# Patient Record
Sex: Female | Born: 1940 | Race: White | Hispanic: No | Marital: Married | Smoking: Never smoker
Health system: Southern US, Community
[De-identification: ages and names within clinical notes are randomized; demographics above are authoritative.]

## PROBLEM LIST (undated history)

## (undated) DIAGNOSIS — B029 Zoster without complications: Secondary | ICD-10-CM

## (undated) HISTORY — PX: BREAST SURGERY: SHX581

## (undated) HISTORY — PX: ABDOMINAL HYSTERECTOMY: SHX81

## (undated) HISTORY — DX: Zoster without complications: B02.9

---

## 2013-11-11 DIAGNOSIS — Z1231 Encounter for screening mammogram for malignant neoplasm of breast: Secondary | ICD-10-CM | POA: Diagnosis not present

## 2014-07-01 DIAGNOSIS — Z23 Encounter for immunization: Secondary | ICD-10-CM | POA: Diagnosis not present

## 2014-07-19 DIAGNOSIS — L821 Other seborrheic keratosis: Secondary | ICD-10-CM | POA: Diagnosis not present

## 2014-07-23 DIAGNOSIS — J9801 Acute bronchospasm: Secondary | ICD-10-CM | POA: Diagnosis not present

## 2014-07-26 DIAGNOSIS — H35363 Drusen (degenerative) of macula, bilateral: Secondary | ICD-10-CM | POA: Diagnosis not present

## 2014-07-26 DIAGNOSIS — H2513 Age-related nuclear cataract, bilateral: Secondary | ICD-10-CM | POA: Diagnosis not present

## 2014-07-28 DIAGNOSIS — R05 Cough: Secondary | ICD-10-CM | POA: Diagnosis not present

## 2014-11-28 DIAGNOSIS — Z1231 Encounter for screening mammogram for malignant neoplasm of breast: Secondary | ICD-10-CM | POA: Diagnosis not present

## 2015-02-16 DIAGNOSIS — J069 Acute upper respiratory infection, unspecified: Secondary | ICD-10-CM | POA: Diagnosis not present

## 2015-04-05 DIAGNOSIS — M8580 Other specified disorders of bone density and structure, unspecified site: Secondary | ICD-10-CM | POA: Diagnosis not present

## 2015-04-05 DIAGNOSIS — Z79899 Other long term (current) drug therapy: Secondary | ICD-10-CM | POA: Diagnosis not present

## 2015-04-05 DIAGNOSIS — Z23 Encounter for immunization: Secondary | ICD-10-CM | POA: Diagnosis not present

## 2015-04-05 DIAGNOSIS — E559 Vitamin D deficiency, unspecified: Secondary | ICD-10-CM | POA: Diagnosis not present

## 2015-04-05 DIAGNOSIS — Z0000 Encounter for general adult medical examination without abnormal findings: Secondary | ICD-10-CM | POA: Diagnosis not present

## 2015-04-05 DIAGNOSIS — E782 Mixed hyperlipidemia: Secondary | ICD-10-CM | POA: Diagnosis not present

## 2015-04-05 DIAGNOSIS — I2510 Atherosclerotic heart disease of native coronary artery without angina pectoris: Secondary | ICD-10-CM | POA: Diagnosis not present

## 2015-05-22 DIAGNOSIS — J209 Acute bronchitis, unspecified: Secondary | ICD-10-CM | POA: Diagnosis not present

## 2015-06-22 DIAGNOSIS — Z23 Encounter for immunization: Secondary | ICD-10-CM | POA: Diagnosis not present

## 2015-07-07 DIAGNOSIS — E782 Mixed hyperlipidemia: Secondary | ICD-10-CM | POA: Diagnosis not present

## 2015-11-29 DIAGNOSIS — Z1231 Encounter for screening mammogram for malignant neoplasm of breast: Secondary | ICD-10-CM | POA: Diagnosis not present

## 2016-01-02 DIAGNOSIS — M6701 Short Achilles tendon (acquired), right ankle: Secondary | ICD-10-CM | POA: Diagnosis not present

## 2016-01-02 DIAGNOSIS — M722 Plantar fascial fibromatosis: Secondary | ICD-10-CM | POA: Diagnosis not present

## 2016-01-08 DIAGNOSIS — K581 Irritable bowel syndrome with constipation: Secondary | ICD-10-CM | POA: Diagnosis not present

## 2016-01-08 DIAGNOSIS — R109 Unspecified abdominal pain: Secondary | ICD-10-CM | POA: Diagnosis not present

## 2016-01-08 DIAGNOSIS — N8111 Cystocele, midline: Secondary | ICD-10-CM | POA: Diagnosis not present

## 2016-01-24 DIAGNOSIS — N8111 Cystocele, midline: Secondary | ICD-10-CM | POA: Diagnosis not present

## 2016-03-15 DIAGNOSIS — R040 Epistaxis: Secondary | ICD-10-CM | POA: Diagnosis not present

## 2016-03-15 DIAGNOSIS — J340 Abscess, furuncle and carbuncle of nose: Secondary | ICD-10-CM | POA: Diagnosis not present

## 2016-04-22 DIAGNOSIS — J3489 Other specified disorders of nose and nasal sinuses: Secondary | ICD-10-CM | POA: Diagnosis not present

## 2016-04-22 DIAGNOSIS — Z1389 Encounter for screening for other disorder: Secondary | ICD-10-CM | POA: Diagnosis not present

## 2016-04-22 DIAGNOSIS — Z0000 Encounter for general adult medical examination without abnormal findings: Secondary | ICD-10-CM | POA: Diagnosis not present

## 2016-04-22 DIAGNOSIS — N8111 Cystocele, midline: Secondary | ICD-10-CM | POA: Diagnosis not present

## 2016-04-22 DIAGNOSIS — E782 Mixed hyperlipidemia: Secondary | ICD-10-CM | POA: Diagnosis not present

## 2016-04-22 DIAGNOSIS — Z23 Encounter for immunization: Secondary | ICD-10-CM | POA: Diagnosis not present

## 2016-04-22 DIAGNOSIS — Z79899 Other long term (current) drug therapy: Secondary | ICD-10-CM | POA: Diagnosis not present

## 2016-04-30 DIAGNOSIS — D485 Neoplasm of uncertain behavior of skin: Secondary | ICD-10-CM | POA: Diagnosis not present

## 2016-04-30 DIAGNOSIS — C44519 Basal cell carcinoma of skin of other part of trunk: Secondary | ICD-10-CM | POA: Diagnosis not present

## 2016-04-30 DIAGNOSIS — L821 Other seborrheic keratosis: Secondary | ICD-10-CM | POA: Diagnosis not present

## 2016-05-02 DIAGNOSIS — R040 Epistaxis: Secondary | ICD-10-CM | POA: Diagnosis not present

## 2016-05-14 DIAGNOSIS — C44519 Basal cell carcinoma of skin of other part of trunk: Secondary | ICD-10-CM | POA: Diagnosis not present

## 2016-05-28 DIAGNOSIS — Z23 Encounter for immunization: Secondary | ICD-10-CM | POA: Diagnosis not present

## 2016-06-11 DIAGNOSIS — H2513 Age-related nuclear cataract, bilateral: Secondary | ICD-10-CM | POA: Diagnosis not present

## 2016-06-11 DIAGNOSIS — H35363 Drusen (degenerative) of macula, bilateral: Secondary | ICD-10-CM | POA: Diagnosis not present

## 2016-07-30 DIAGNOSIS — Z85828 Personal history of other malignant neoplasm of skin: Secondary | ICD-10-CM | POA: Diagnosis not present

## 2016-07-30 DIAGNOSIS — L821 Other seborrheic keratosis: Secondary | ICD-10-CM | POA: Diagnosis not present

## 2016-07-30 DIAGNOSIS — Z08 Encounter for follow-up examination after completed treatment for malignant neoplasm: Secondary | ICD-10-CM | POA: Diagnosis not present

## 2016-09-04 DIAGNOSIS — J209 Acute bronchitis, unspecified: Secondary | ICD-10-CM | POA: Diagnosis not present

## 2016-09-17 DIAGNOSIS — Z23 Encounter for immunization: Secondary | ICD-10-CM | POA: Diagnosis not present

## 2016-12-16 DIAGNOSIS — Z1231 Encounter for screening mammogram for malignant neoplasm of breast: Secondary | ICD-10-CM | POA: Diagnosis not present

## 2016-12-23 DIAGNOSIS — J22 Unspecified acute lower respiratory infection: Secondary | ICD-10-CM | POA: Diagnosis not present

## 2016-12-23 DIAGNOSIS — Z6822 Body mass index (BMI) 22.0-22.9, adult: Secondary | ICD-10-CM | POA: Diagnosis not present

## 2017-02-20 DIAGNOSIS — R040 Epistaxis: Secondary | ICD-10-CM | POA: Diagnosis not present

## 2017-04-04 DIAGNOSIS — Z6822 Body mass index (BMI) 22.0-22.9, adult: Secondary | ICD-10-CM | POA: Diagnosis not present

## 2017-04-04 DIAGNOSIS — J22 Unspecified acute lower respiratory infection: Secondary | ICD-10-CM | POA: Diagnosis not present

## 2017-05-01 DIAGNOSIS — M1611 Unilateral primary osteoarthritis, right hip: Secondary | ICD-10-CM | POA: Diagnosis not present

## 2017-05-06 DIAGNOSIS — M1611 Unilateral primary osteoarthritis, right hip: Secondary | ICD-10-CM | POA: Diagnosis not present

## 2017-05-16 DIAGNOSIS — M1611 Unilateral primary osteoarthritis, right hip: Secondary | ICD-10-CM | POA: Diagnosis not present

## 2017-05-16 DIAGNOSIS — M25551 Pain in right hip: Secondary | ICD-10-CM | POA: Diagnosis not present

## 2017-05-29 DIAGNOSIS — Z23 Encounter for immunization: Secondary | ICD-10-CM | POA: Diagnosis not present

## 2017-06-17 ENCOUNTER — Encounter: Payer: Self-pay | Admitting: Cardiology

## 2017-06-17 DIAGNOSIS — Z0000 Encounter for general adult medical examination without abnormal findings: Secondary | ICD-10-CM | POA: Diagnosis not present

## 2017-06-17 DIAGNOSIS — M1611 Unilateral primary osteoarthritis, right hip: Secondary | ICD-10-CM | POA: Diagnosis not present

## 2017-06-17 DIAGNOSIS — Z79899 Other long term (current) drug therapy: Secondary | ICD-10-CM | POA: Diagnosis not present

## 2017-06-17 DIAGNOSIS — E782 Mixed hyperlipidemia: Secondary | ICD-10-CM | POA: Diagnosis not present

## 2017-06-17 DIAGNOSIS — E559 Vitamin D deficiency, unspecified: Secondary | ICD-10-CM | POA: Diagnosis not present

## 2017-06-25 DIAGNOSIS — H2513 Age-related nuclear cataract, bilateral: Secondary | ICD-10-CM | POA: Diagnosis not present

## 2017-06-25 DIAGNOSIS — H35363 Drusen (degenerative) of macula, bilateral: Secondary | ICD-10-CM | POA: Diagnosis not present

## 2017-07-30 DIAGNOSIS — M545 Low back pain: Secondary | ICD-10-CM | POA: Diagnosis not present

## 2017-07-30 DIAGNOSIS — M25552 Pain in left hip: Secondary | ICD-10-CM | POA: Diagnosis not present

## 2017-07-30 DIAGNOSIS — M25551 Pain in right hip: Secondary | ICD-10-CM | POA: Diagnosis not present

## 2017-08-06 DIAGNOSIS — Z6822 Body mass index (BMI) 22.0-22.9, adult: Secondary | ICD-10-CM | POA: Diagnosis not present

## 2017-08-06 DIAGNOSIS — J069 Acute upper respiratory infection, unspecified: Secondary | ICD-10-CM | POA: Diagnosis not present

## 2017-08-07 DIAGNOSIS — J069 Acute upper respiratory infection, unspecified: Secondary | ICD-10-CM | POA: Diagnosis not present

## 2017-08-07 DIAGNOSIS — Z6822 Body mass index (BMI) 22.0-22.9, adult: Secondary | ICD-10-CM | POA: Diagnosis not present

## 2017-11-14 DIAGNOSIS — Z6823 Body mass index (BMI) 23.0-23.9, adult: Secondary | ICD-10-CM | POA: Diagnosis not present

## 2017-11-14 DIAGNOSIS — H6981 Other specified disorders of Eustachian tube, right ear: Secondary | ICD-10-CM | POA: Diagnosis not present

## 2017-11-14 DIAGNOSIS — J0100 Acute maxillary sinusitis, unspecified: Secondary | ICD-10-CM | POA: Diagnosis not present

## 2017-12-03 DIAGNOSIS — Z6823 Body mass index (BMI) 23.0-23.9, adult: Secondary | ICD-10-CM | POA: Diagnosis not present

## 2017-12-03 DIAGNOSIS — J069 Acute upper respiratory infection, unspecified: Secondary | ICD-10-CM | POA: Diagnosis not present

## 2017-12-03 DIAGNOSIS — R509 Fever, unspecified: Secondary | ICD-10-CM | POA: Diagnosis not present

## 2017-12-03 DIAGNOSIS — J0100 Acute maxillary sinusitis, unspecified: Secondary | ICD-10-CM | POA: Diagnosis not present

## 2017-12-26 DIAGNOSIS — Z1231 Encounter for screening mammogram for malignant neoplasm of breast: Secondary | ICD-10-CM | POA: Diagnosis not present

## 2018-03-26 ENCOUNTER — Other Ambulatory Visit: Payer: Self-pay

## 2018-05-13 DIAGNOSIS — Z6821 Body mass index (BMI) 21.0-21.9, adult: Secondary | ICD-10-CM | POA: Diagnosis not present

## 2018-05-13 DIAGNOSIS — R0600 Dyspnea, unspecified: Secondary | ICD-10-CM | POA: Diagnosis not present

## 2018-05-13 DIAGNOSIS — R0602 Shortness of breath: Secondary | ICD-10-CM | POA: Diagnosis not present

## 2018-05-13 DIAGNOSIS — R0789 Other chest pain: Secondary | ICD-10-CM | POA: Diagnosis not present

## 2018-05-13 DIAGNOSIS — R457 State of emotional shock and stress, unspecified: Secondary | ICD-10-CM | POA: Diagnosis not present

## 2018-05-13 DIAGNOSIS — R9431 Abnormal electrocardiogram [ECG] [EKG]: Secondary | ICD-10-CM | POA: Diagnosis not present

## 2018-05-18 ENCOUNTER — Encounter: Payer: Self-pay | Admitting: Cardiology

## 2018-05-18 DIAGNOSIS — R05 Cough: Secondary | ICD-10-CM | POA: Insufficient documentation

## 2018-05-18 DIAGNOSIS — R0602 Shortness of breath: Secondary | ICD-10-CM | POA: Insufficient documentation

## 2018-05-18 DIAGNOSIS — R059 Cough, unspecified: Secondary | ICD-10-CM | POA: Insufficient documentation

## 2018-05-18 DIAGNOSIS — R0789 Other chest pain: Secondary | ICD-10-CM | POA: Insufficient documentation

## 2018-05-19 ENCOUNTER — Encounter: Payer: Self-pay | Admitting: Cardiology

## 2018-05-19 ENCOUNTER — Ambulatory Visit (INDEPENDENT_AMBULATORY_CARE_PROVIDER_SITE_OTHER): Payer: Medicare Other | Admitting: Cardiology

## 2018-05-19 VITALS — BP 118/72 | HR 81 | Ht 61.0 in | Wt 117.0 lb

## 2018-05-19 DIAGNOSIS — R05 Cough: Secondary | ICD-10-CM

## 2018-05-19 DIAGNOSIS — E782 Mixed hyperlipidemia: Secondary | ICD-10-CM | POA: Insufficient documentation

## 2018-05-19 DIAGNOSIS — R0789 Other chest pain: Secondary | ICD-10-CM | POA: Diagnosis not present

## 2018-05-19 DIAGNOSIS — R0602 Shortness of breath: Secondary | ICD-10-CM | POA: Diagnosis not present

## 2018-05-19 DIAGNOSIS — R059 Cough, unspecified: Secondary | ICD-10-CM

## 2018-05-19 NOTE — Progress Notes (Signed)
Cardiology Office Note:    Date:  05/19/2018   ID:  Heather Henderson, DOB [birth year], MRN [id number]  PCP:  Myer Peer, MD  Cardiologist:  Jenean Lindau, MD   Referring MD: Myer Peer, MD    ASSESSMENT:    1. SOB (shortness of breath)   2. Cough   3. Chest discomfort   4. Mixed dyslipidemia    PLAN:    In order of problems listed above:  1. Primary prevention stressed to the patient.  Importance of compliance with diet and medication stressed and she vocalized understanding.  Her blood pressure is stable.  Her lipids were markedly elevated in the past and she has been on rosuvastatin after that.  Diet was discussed with dyslipidemia. 2. In view of her symptoms of shortness of breath I would like to do a d-dimer.  She has had breakfast this morning.  She does not want to get blood work done today.  She says she will come in the morning and get all blood work done including fasting lipids and d-dimer. 3. Echocardiogram will be done to assess murmur heard on auscultation. 4. In view of her significant symptoms and risk factors she will undergo exercise stress Cardiolite.  She knows to go to the nearest emergency room for any concerning symptoms.  Her daughter is a Marine scientist. 5. Patient will be seen in follow-up appointment in 6 months or earlier if the patient has any concerns    Medication Adjustments/Labs and Tests Ordered: Current medicines are reviewed at length with the patient today.  Concerns regarding medicines are outlined above.  No orders of the defined types were placed in this encounter.  No orders of the defined types were placed in this encounter.    History of Present Illness:    Heather Henderson is a 77 y.o. female who is being seen today for the evaluation of chest discomfort and shortness of breath on exertion at the request of Myer Peer, MD.  Patient is a pleasant 77 year old female.  She has past medical history of dyslipidemia.  She mentions to me that  she had one episode of shortness of breath on exertion and she went to the emergency room and was evaluated and discharged.  Subsequently she is done fine.  She occasionally has chest discomfort not related to exertion.  No orthopnea or PND or radiation to any part of the body.  Her daughter accompanies her for this visit.  She mentions to me that she is generally fatigued and wants her to be more active.  Past Medical History:  Diagnosis Date  . Herpes zoster     Past Surgical History:  Procedure Laterality Date  . ABDOMINAL HYSTERECTOMY    . BREAST SURGERY      Current Medications: Current Meds  Medication Sig  . azelastine (ASTELIN) 0.1 % nasal spray Place 1 spray into both nostrils 2 (two) times daily. Use in each nostril as directed  . azithromycin (ZITHROMAX) 500 MG tablet Take 500 mg by mouth daily.  . celecoxib (CELEBREX) 200 MG capsule Take 200 mg by mouth as needed.   . fluticasone (FLONASE) 50 MCG/ACT nasal spray Place 1 spray into both nostrils daily.  . rosuvastatin (CRESTOR) 10 MG tablet Take 10 mg by mouth daily.     Allergies:   Patient has no known allergies.   Social History   Socioeconomic History  . Marital status: Married    Spouse name: Not on file  . Number  of children: Not on file  . Years of education: Not on file  . Highest education level: Not on file  Occupational History  . Not on file  Social Needs  . Financial resource strain: Not on file  . Food insecurity:    Worry: Not on file    Inability: Not on file  . Transportation needs:    Medical: Not on file    Non-medical: Not on file  Tobacco Use  . Smoking status: Never Smoker  . Smokeless tobacco: Never Used  Substance and Sexual Activity  . Alcohol use: Not on file  . Drug use: Not on file  . Sexual activity: Not on file  Lifestyle  . Physical activity:    Days per week: Not on file    Minutes per session: Not on file  . Stress: Not on file  Relationships  . Social connections:     Talks on phone: Not on file    Gets together: Not on file    Attends religious service: Not on file    Active member of club or organization: Not on file    Attends meetings of clubs or organizations: Not on file    Relationship status: Not on file  Other Topics Concern  . Not on file  Social History Narrative  . Not on file     Family History: The patient's family history includes COPD in her mother; Heart disease in her father.  ROS:   Please see the history of present illness.    All other systems reviewed and are negative.  EKGs/Labs/Other Studies Reviewed:    The following studies were reviewed today: EKG reveals sinus rhythm and nonspecific ST-T changes   Recent Labs: No results found for requested labs within last 8760 hours.  Recent Lipid Panel No results found for: CHOL, TRIG, HDL, CHOLHDL, VLDL, LDLCALC, LDLDIRECT  Physical Exam:    VS:  BP 118/72 (BP Location: Right Arm, Patient Position: Sitting, Cuff Size: Normal)   Pulse 81   Ht 5\' 1"  (1.549 m)   Wt 117 lb (53.1 kg)   SpO2 98%   BMI 22.11 kg/m     Wt Readings from Last 3 Encounters:  05/19/18 117 lb (53.1 kg)     GEN: Patient is in no acute distress HEENT: Normal NECK: No JVD; No carotid bruits LYMPHATICS: No lymphadenopathy CARDIAC: S1 S2 regular, 2/6 systolic murmur at the apex. RESPIRATORY:  Clear to auscultation without rales, wheezing or rhonchi  ABDOMEN: Soft, non-tender, non-distended MUSCULOSKELETAL:  No edema; No deformity  SKIN: Warm and dry NEUROLOGIC:  Alert and oriented x 3 PSYCHIATRIC:  Normal affect    Signed, Jenean Lindau, MD  05/19/2018 11:39 AM    [place]

## 2018-05-19 NOTE — Patient Instructions (Addendum)
Medication Instructions:  Your physician recommends that you continue on your current medications as directed. Please refer to the Current Medication list given to you today.  Labwork: Your physician recommends that you have the following labs drawn: BMP, CBC, TSH, d-dimer today.  Please come fasting for liver and lipid panel. No appointment necessary.  Testing/Procedures: Your physician has requested that you have an echocardiogram. Echocardiography is a painless test that uses sound waves to create images of your heart. It provides your doctor with information about the size and shape of your heart and how well your heart's chambers and valves are working. This procedure takes approximately one hour. There are no restrictions for this procedure.  Your physician has requested that you have en exercise stress myoview. For further information please visit [email or web address]. Please follow instruction sheet, as given.  Follow-Up: Your physician recommends that you schedule a follow-up appointment in: 6 months  Any Other Special Instructions Will Be Listed Below (If Applicable).     If you need a refill on your cardiac medications before your next appointment, please call your pharmacy.   [place], RN, BSN

## 2018-05-19 NOTE — Addendum Note (Signed)
Addended by: Mattie Marlin on: 05/19/2018 11:55 AM   Modules accepted: Orders

## 2018-05-20 ENCOUNTER — Encounter: Payer: Self-pay | Admitting: Cardiology

## 2018-05-20 ENCOUNTER — Other Ambulatory Visit: Payer: Self-pay

## 2018-05-20 ENCOUNTER — Telehealth: Payer: Self-pay | Admitting: Physician Assistant

## 2018-05-20 ENCOUNTER — Other Ambulatory Visit: Payer: Self-pay | Admitting: Cardiology

## 2018-05-20 DIAGNOSIS — R0602 Shortness of breath: Secondary | ICD-10-CM | POA: Diagnosis not present

## 2018-05-20 DIAGNOSIS — R7989 Other specified abnormal findings of blood chemistry: Secondary | ICD-10-CM | POA: Diagnosis not present

## 2018-05-20 DIAGNOSIS — E782 Mixed hyperlipidemia: Secondary | ICD-10-CM | POA: Diagnosis not present

## 2018-05-20 LAB — BASIC METABOLIC PANEL
BUN / CREAT RATIO: 22 (ref 12–28)
BUN: 13 mg/dL (ref 8–27)
CALCIUM: 9.4 mg/dL (ref 8.7–10.3)
CHLORIDE: 101 mmol/L (ref 96–106)
CO2: 25 mmol/L (ref 20–29)
Creatinine, Ser: 0.6 mg/dL (ref 0.57–1.00)
GFR, EST AFRICAN AMERICAN: 102 mL/min/{1.73_m2} (ref >59)
GFR, EST NON AFRICAN AMERICAN: 88 mL/min/{1.73_m2} (ref >59)
Glucose: 79 mg/dL (ref 65–99)
POTASSIUM: 5.1 mmol/L (ref 3.5–5.2)
Sodium: 141 mmol/L (ref 134–144)

## 2018-05-20 LAB — HEPATIC FUNCTION PANEL
ALBUMIN: 4.6 g/dL (ref 3.5–4.8)
ALT: 16 IU/L (ref 0–32)
AST: 22 IU/L (ref 0–40)
Alkaline Phosphatase: 78 IU/L (ref 39–117)
BILIRUBIN TOTAL: 0.6 mg/dL (ref 0.0–1.2)
Bilirubin, Direct: 0.16 mg/dL (ref 0.00–0.40)
TOTAL PROTEIN: 6.6 g/dL (ref 6.0–8.5)

## 2018-05-20 LAB — CBC WITH DIFFERENTIAL/PLATELET
BASOS ABS: 0 10*3/uL (ref 0.0–0.2)
BASOS: 1 %
EOS (ABSOLUTE): 0.1 10*3/uL (ref 0.0–0.4)
Eos: 3 %
HEMOGLOBIN: 14.1 g/dL (ref 11.1–15.9)
Hematocrit: 41.2 % (ref 34.0–46.6)
IMMATURE GRANS (ABS): 0 10*3/uL (ref 0.0–0.1)
Immature Granulocytes: 0 %
LYMPHS ABS: 1.1 10*3/uL (ref 0.7–3.1)
LYMPHS: 21 %
MCH: 31.3 pg (ref 26.6–33.0)
MCHC: 34.2 g/dL (ref 31.5–35.7)
MCV: 92 fL (ref 79–97)
MONOCYTES: 8 %
Monocytes Absolute: 0.4 10*3/uL (ref 0.1–0.9)
NEUTROS ABS: 3.4 10*3/uL (ref 1.4–7.0)
Neutrophils: 67 %
PLATELETS: 280 10*3/uL (ref 150–450)
RBC: 4.5 x10E6/uL (ref 3.77–5.28)
RDW: 12.9 % (ref 12.3–15.4)
WBC: 5.1 10*3/uL (ref 3.4–10.8)

## 2018-05-20 LAB — LIPID PANEL
CHOL/HDL RATIO: 3.3 ratio (ref 0.0–4.4)
Cholesterol, Total: 166 mg/dL (ref 100–199)
HDL: 51 mg/dL (ref >39)
LDL CALC: 86 mg/dL (ref 0–99)
Triglycerides: 147 mg/dL (ref 0–149)
VLDL Cholesterol Cal: 29 mg/dL (ref 5–40)

## 2018-05-20 LAB — TSH: TSH: 2.29 u[IU]/mL (ref 0.450–4.500)

## 2018-05-20 LAB — D-DIMER, QUANTITATIVE: D-DIMER: 0.71 mg/L FEU — ABNORMAL HIGH (ref 0.00–0.49)

## 2018-05-20 NOTE — Telephone Encounter (Signed)
Received call from [facility] regarding CT angio that read "negative for acute PE or other acute finding." Radiology relayed result to patient and will route to Dr. Lennox Pippins for further action if needed. I do not have access to full report so he will need to review upon receipt to his inbox . Dayna Dunn PA-C

## 2018-05-21 ENCOUNTER — Telehealth: Payer: Self-pay

## 2018-05-21 NOTE — Telephone Encounter (Signed)
Patient was called and notified of her test results.

## 2018-05-26 ENCOUNTER — Telehealth: Payer: Self-pay | Admitting: Cardiology

## 2018-05-26 NOTE — Telephone Encounter (Signed)
Has been having really bad SOB

## 2018-05-27 NOTE — Telephone Encounter (Signed)
Per the patient on Saturday she felt as if she got very short of breath with exertion. Patient did taker her pulse and BP readsing were: 147/74 pulse 98 and 140/67 and pulse 80. The patient states this only occurred on Saturday as she has been "taking it easy" to avoid getting short of breath again. Echo and lexi has been ordered for next week. Please advice for any changes or recommendations?

## 2018-05-27 NOTE — Telephone Encounter (Signed)
This was expressed to the patient and she was understanding.

## 2018-05-28 ENCOUNTER — Telehealth (HOSPITAL_COMMUNITY): Payer: Self-pay | Admitting: *Deleted

## 2018-05-28 NOTE — Telephone Encounter (Signed)
Patient given detailed instructions per Myocardial Perfusion Study Information Sheet for the test on 06/02/18. Patient notified to arrive 15 minutes early and that it is imperative to arrive on time for appointment to keep from having the test rescheduled.  If you need to cancel or reschedule your appointment, please call the office within 24 hours of your appointment. . Patient verbalized understanding. Kirstie Peri

## 2018-06-01 DIAGNOSIS — Z6822 Body mass index (BMI) 22.0-22.9, adult: Secondary | ICD-10-CM | POA: Diagnosis not present

## 2018-06-01 DIAGNOSIS — J329 Chronic sinusitis, unspecified: Secondary | ICD-10-CM | POA: Diagnosis not present

## 2018-06-01 DIAGNOSIS — Z23 Encounter for immunization: Secondary | ICD-10-CM | POA: Diagnosis not present

## 2018-06-01 DIAGNOSIS — J40 Bronchitis, not specified as acute or chronic: Secondary | ICD-10-CM | POA: Diagnosis not present

## 2018-06-02 ENCOUNTER — Ambulatory Visit (INDEPENDENT_AMBULATORY_CARE_PROVIDER_SITE_OTHER): Payer: Medicare Other

## 2018-06-02 VITALS — Ht 61.0 in | Wt 117.0 lb

## 2018-06-02 DIAGNOSIS — R0789 Other chest pain: Secondary | ICD-10-CM | POA: Diagnosis not present

## 2018-06-02 DIAGNOSIS — R0602 Shortness of breath: Secondary | ICD-10-CM | POA: Diagnosis not present

## 2018-06-02 LAB — MYOCARDIAL PERFUSION IMAGING
CHL CUP NUCLEAR SDS: 0
CHL CUP NUCLEAR SRS: 0
CSEPPHR: 129 {beats}/min
Estimated workload: 8.9 METS
Exercise duration (min): 7 min
Exercise duration (sec): 1 s
LVDIAVOL: 35 mL (ref 46–106)
LVSYSVOL: 7 mL
MPHR: 143 {beats}/min
Percent HR: 90 %
Rest HR: 71 {beats}/min
SSS: 0
TID: 0.92

## 2018-06-02 IMAGING — NM NM CHEST EXAM
6 series · 36 of 36 positions shown · non-contrast
Comparison: none

[Series 1: rest sax · 6.4mm · 6.40mm/px · 6 of 21 frames shown]
[frame 2/21]
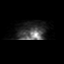
[frame 6/21]
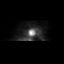
[frame 9/21]
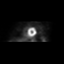
[frame 13/21]
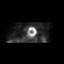
[frame 16/21]
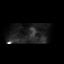
[frame 20/21]
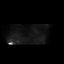

[Series 1: wbr rest · 6.40mm/px · 6 of 64 frames shown]
[frame 6/64]
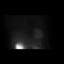
[frame 16/64]
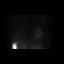
[frame 27/64]
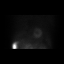
[frame 38/64]
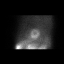
[frame 48/64]
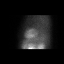
[frame 59/64]
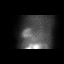

[Series 2: wbr stress-gsp · 6.40mm/px · 6 of 512 frames shown]
[frame 43/512]
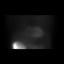
[frame 128/512]
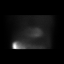
[frame 214/512]
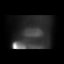
[frame 299/512]
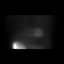
[frame 384/512]
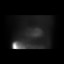
[frame 470/512]
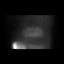

[Series 2: stress sax gs · 6.4mm · 6.40mm/px · 6 of 168 frames shown]
[frame 15/168]
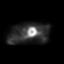
[frame 43/168]
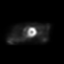
[frame 71/168]
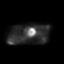
[frame 99/168]
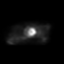
[frame 127/168]
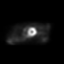
[frame 155/168]
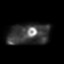

[Series 3: stress sax · 6.4mm · 6.40mm/px · 6 of 21 frames shown]
[frame 2/21]
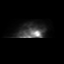
[frame 6/21]
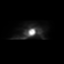
[frame 9/21]
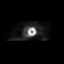
[frame 13/21]
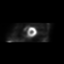
[frame 16/21]
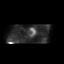
[frame 20/21]
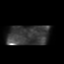

[Series 3: wbr stress-sum-em · 6.40mm/px · 6 of 64 frames shown]
[frame 6/64]
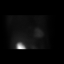
[frame 16/64]
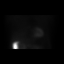
[frame 27/64]
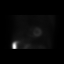
[frame 38/64]
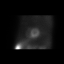
[frame 48/64]
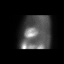
[frame 59/64]
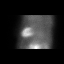

[36 of 36 positions shown; findings below may reference images not displayed]

Canned report from images found in remote index.

Refer to host system for actual result text.

## 2018-06-02 MED ORDER — TECHNETIUM TC 99M TETROFOSMIN IV KIT
32.4000 | PACK | Freq: Once | INTRAVENOUS | Status: AC | PRN
  Administered 2018-06-02: 32.4 via INTRAVENOUS

## 2018-06-02 MED ORDER — TECHNETIUM TC 99M TETROFOSMIN IV KIT
9.8000 | PACK | Freq: Once | INTRAVENOUS | Status: AC | PRN
  Administered 2018-06-02: 9.8 via INTRAVENOUS

## 2018-06-03 ENCOUNTER — Ambulatory Visit (INDEPENDENT_AMBULATORY_CARE_PROVIDER_SITE_OTHER): Payer: Medicare Other

## 2018-06-03 DIAGNOSIS — R0602 Shortness of breath: Secondary | ICD-10-CM | POA: Diagnosis not present

## 2018-06-03 DIAGNOSIS — R0789 Other chest pain: Secondary | ICD-10-CM

## 2018-06-03 NOTE — Progress Notes (Signed)
Complete echocardiogram has been performed.  Jimmy Ozzie Knobel RDCS, RVT 

## 2018-06-04 ENCOUNTER — Telehealth: Payer: Self-pay

## 2018-06-04 NOTE — Telephone Encounter (Signed)
-----   Message from Jenean Lindau, MD sent at 06/04/2018  8:14 AM EDT ----- The results of the study is unremarkable. Please inform patient. I will discuss in detail at next appointment. Cc  primary care/referring physician Jenean Lindau, MD 06/04/2018 8:14 AM

## 2018-06-04 NOTE — Telephone Encounter (Signed)
Patient called and notified of test results. 

## 2018-07-16 DIAGNOSIS — H35363 Drusen (degenerative) of macula, bilateral: Secondary | ICD-10-CM | POA: Diagnosis not present

## 2018-07-16 DIAGNOSIS — H2513 Age-related nuclear cataract, bilateral: Secondary | ICD-10-CM | POA: Diagnosis not present

## 2018-08-20 DIAGNOSIS — Z87898 Personal history of other specified conditions: Secondary | ICD-10-CM | POA: Diagnosis not present

## 2018-08-20 DIAGNOSIS — M160 Bilateral primary osteoarthritis of hip: Secondary | ICD-10-CM | POA: Diagnosis not present

## 2018-08-20 DIAGNOSIS — E782 Mixed hyperlipidemia: Secondary | ICD-10-CM | POA: Diagnosis not present

## 2018-08-20 DIAGNOSIS — E559 Vitamin D deficiency, unspecified: Secondary | ICD-10-CM | POA: Diagnosis not present

## 2018-08-20 DIAGNOSIS — Z0000 Encounter for general adult medical examination without abnormal findings: Secondary | ICD-10-CM | POA: Diagnosis not present

## 2018-11-12 ENCOUNTER — Ambulatory Visit: Payer: Medicare Other | Admitting: Cardiology

## 2018-12-28 DIAGNOSIS — Z1231 Encounter for screening mammogram for malignant neoplasm of breast: Secondary | ICD-10-CM | POA: Diagnosis not present

## 2019-03-23 DIAGNOSIS — K120 Recurrent oral aphthae: Secondary | ICD-10-CM | POA: Diagnosis not present

## 2019-04-01 DIAGNOSIS — D485 Neoplasm of uncertain behavior of skin: Secondary | ICD-10-CM | POA: Diagnosis not present

## 2019-04-01 DIAGNOSIS — L820 Inflamed seborrheic keratosis: Secondary | ICD-10-CM | POA: Diagnosis not present

## 2019-04-01 DIAGNOSIS — Z85828 Personal history of other malignant neoplasm of skin: Secondary | ICD-10-CM | POA: Diagnosis not present

## 2019-04-01 DIAGNOSIS — Z08 Encounter for follow-up examination after completed treatment for malignant neoplasm: Secondary | ICD-10-CM | POA: Diagnosis not present

## 2019-04-01 DIAGNOSIS — L821 Other seborrheic keratosis: Secondary | ICD-10-CM | POA: Diagnosis not present

## 2019-04-01 DIAGNOSIS — L578 Other skin changes due to chronic exposure to nonionizing radiation: Secondary | ICD-10-CM | POA: Diagnosis not present

## 2019-05-22 DIAGNOSIS — Z23 Encounter for immunization: Secondary | ICD-10-CM | POA: Diagnosis not present

## 2019-05-27 DIAGNOSIS — M160 Bilateral primary osteoarthritis of hip: Secondary | ICD-10-CM | POA: Diagnosis not present

## 2019-05-27 DIAGNOSIS — R739 Hyperglycemia, unspecified: Secondary | ICD-10-CM | POA: Diagnosis not present

## 2019-05-27 DIAGNOSIS — E782 Mixed hyperlipidemia: Secondary | ICD-10-CM | POA: Diagnosis not present

## 2019-05-27 DIAGNOSIS — F419 Anxiety disorder, unspecified: Secondary | ICD-10-CM | POA: Diagnosis not present

## 2019-05-27 DIAGNOSIS — M8580 Other specified disorders of bone density and structure, unspecified site: Secondary | ICD-10-CM | POA: Diagnosis not present

## 2019-05-27 DIAGNOSIS — Z79899 Other long term (current) drug therapy: Secondary | ICD-10-CM | POA: Diagnosis not present

## 2019-07-19 DIAGNOSIS — H35363 Drusen (degenerative) of macula, bilateral: Secondary | ICD-10-CM | POA: Diagnosis not present

## 2019-07-19 DIAGNOSIS — H43393 Other vitreous opacities, bilateral: Secondary | ICD-10-CM | POA: Diagnosis not present

## 2020-03-31 DIAGNOSIS — J0120 Acute ethmoidal sinusitis, unspecified: Secondary | ICD-10-CM | POA: Diagnosis not present

## 2020-11-14 DIAGNOSIS — E782 Mixed hyperlipidemia: Secondary | ICD-10-CM | POA: Diagnosis not present

## 2020-11-14 DIAGNOSIS — I25119 Atherosclerotic heart disease of native coronary artery with unspecified angina pectoris: Secondary | ICD-10-CM | POA: Diagnosis not present

## 2020-11-14 DIAGNOSIS — H9313 Tinnitus, bilateral: Secondary | ICD-10-CM | POA: Diagnosis not present

## 2020-11-14 DIAGNOSIS — J301 Allergic rhinitis due to pollen: Secondary | ICD-10-CM | POA: Diagnosis not present

## 2020-11-23 DIAGNOSIS — H524 Presbyopia: Secondary | ICD-10-CM | POA: Diagnosis not present

## 2020-11-23 DIAGNOSIS — H35363 Drusen (degenerative) of macula, bilateral: Secondary | ICD-10-CM | POA: Diagnosis not present

## 2020-11-23 DIAGNOSIS — H2513 Age-related nuclear cataract, bilateral: Secondary | ICD-10-CM | POA: Diagnosis not present

## 2020-11-23 DIAGNOSIS — H3093 Unspecified chorioretinal inflammation, bilateral: Secondary | ICD-10-CM | POA: Diagnosis not present

## 2020-11-23 DIAGNOSIS — H43393 Other vitreous opacities, bilateral: Secondary | ICD-10-CM | POA: Diagnosis not present

## 2020-11-23 DIAGNOSIS — H43811 Vitreous degeneration, right eye: Secondary | ICD-10-CM | POA: Diagnosis not present

## 2021-01-01 DIAGNOSIS — Z1231 Encounter for screening mammogram for malignant neoplasm of breast: Secondary | ICD-10-CM | POA: Diagnosis not present

## 2021-01-31 DIAGNOSIS — M25541 Pain in joints of right hand: Secondary | ICD-10-CM | POA: Diagnosis not present

## 2021-01-31 DIAGNOSIS — M25542 Pain in joints of left hand: Secondary | ICD-10-CM | POA: Diagnosis not present

## 2021-01-31 DIAGNOSIS — M19031 Primary osteoarthritis, right wrist: Secondary | ICD-10-CM | POA: Diagnosis not present

## 2021-01-31 DIAGNOSIS — M19032 Primary osteoarthritis, left wrist: Secondary | ICD-10-CM | POA: Diagnosis not present

## 2021-02-01 DIAGNOSIS — K1120 Sialoadenitis, unspecified: Secondary | ICD-10-CM | POA: Diagnosis not present

## 2021-02-01 DIAGNOSIS — K0500 Acute gingivitis, plaque induced: Secondary | ICD-10-CM | POA: Diagnosis not present

## 2021-02-01 DIAGNOSIS — Z6822 Body mass index (BMI) 22.0-22.9, adult: Secondary | ICD-10-CM | POA: Diagnosis not present

## 2021-02-01 DIAGNOSIS — H9313 Tinnitus, bilateral: Secondary | ICD-10-CM | POA: Diagnosis not present

## 2021-03-06 DIAGNOSIS — G5601 Carpal tunnel syndrome, right upper limb: Secondary | ICD-10-CM | POA: Diagnosis not present

## 2021-03-21 DIAGNOSIS — Z87891 Personal history of nicotine dependence: Secondary | ICD-10-CM | POA: Diagnosis not present

## 2021-03-21 DIAGNOSIS — R040 Epistaxis: Secondary | ICD-10-CM | POA: Diagnosis not present

## 2021-03-21 DIAGNOSIS — E785 Hyperlipidemia, unspecified: Secondary | ICD-10-CM | POA: Diagnosis not present

## 2021-03-21 DIAGNOSIS — I10 Essential (primary) hypertension: Secondary | ICD-10-CM | POA: Diagnosis not present

## 2021-03-22 DIAGNOSIS — Z7901 Long term (current) use of anticoagulants: Secondary | ICD-10-CM | POA: Diagnosis not present

## 2021-03-22 DIAGNOSIS — R040 Epistaxis: Secondary | ICD-10-CM | POA: Diagnosis not present

## 2021-03-28 DIAGNOSIS — I252 Old myocardial infarction: Secondary | ICD-10-CM | POA: Diagnosis not present

## 2021-03-28 DIAGNOSIS — E782 Mixed hyperlipidemia: Secondary | ICD-10-CM | POA: Diagnosis not present

## 2021-03-28 DIAGNOSIS — Z7689 Persons encountering health services in other specified circumstances: Secondary | ICD-10-CM | POA: Diagnosis not present

## 2021-03-28 DIAGNOSIS — I10 Essential (primary) hypertension: Secondary | ICD-10-CM | POA: Diagnosis not present

## 2021-03-28 DIAGNOSIS — Z87898 Personal history of other specified conditions: Secondary | ICD-10-CM | POA: Diagnosis not present

## 2021-04-09 DIAGNOSIS — D1801 Hemangioma of skin and subcutaneous tissue: Secondary | ICD-10-CM | POA: Diagnosis not present

## 2021-04-09 DIAGNOSIS — L578 Other skin changes due to chronic exposure to nonionizing radiation: Secondary | ICD-10-CM | POA: Diagnosis not present

## 2021-04-09 DIAGNOSIS — Z129 Encounter for screening for malignant neoplasm, site unspecified: Secondary | ICD-10-CM | POA: Diagnosis not present

## 2021-04-09 DIAGNOSIS — L570 Actinic keratosis: Secondary | ICD-10-CM | POA: Diagnosis not present

## 2021-04-09 DIAGNOSIS — L821 Other seborrheic keratosis: Secondary | ICD-10-CM | POA: Diagnosis not present

## 2021-04-11 DIAGNOSIS — Z7689 Persons encountering health services in other specified circumstances: Secondary | ICD-10-CM | POA: Diagnosis not present

## 2021-04-11 DIAGNOSIS — E782 Mixed hyperlipidemia: Secondary | ICD-10-CM | POA: Diagnosis not present

## 2021-04-11 DIAGNOSIS — M810 Age-related osteoporosis without current pathological fracture: Secondary | ICD-10-CM | POA: Diagnosis not present

## 2021-04-11 DIAGNOSIS — I10 Essential (primary) hypertension: Secondary | ICD-10-CM | POA: Diagnosis not present

## 2021-04-25 DIAGNOSIS — I10 Essential (primary) hypertension: Secondary | ICD-10-CM | POA: Diagnosis not present

## 2021-04-25 DIAGNOSIS — Z87898 Personal history of other specified conditions: Secondary | ICD-10-CM | POA: Diagnosis not present

## 2021-04-25 DIAGNOSIS — Z7689 Persons encountering health services in other specified circumstances: Secondary | ICD-10-CM | POA: Diagnosis not present

## 2021-06-27 DIAGNOSIS — Z0000 Encounter for general adult medical examination without abnormal findings: Secondary | ICD-10-CM | POA: Diagnosis not present

## 2021-06-27 DIAGNOSIS — Z780 Asymptomatic menopausal state: Secondary | ICD-10-CM | POA: Diagnosis not present

## 2021-06-27 DIAGNOSIS — Z7689 Persons encountering health services in other specified circumstances: Secondary | ICD-10-CM | POA: Diagnosis not present

## 2021-06-27 DIAGNOSIS — Z23 Encounter for immunization: Secondary | ICD-10-CM | POA: Diagnosis not present

## 2021-08-01 DIAGNOSIS — Z780 Asymptomatic menopausal state: Secondary | ICD-10-CM | POA: Diagnosis not present

## 2021-08-30 DIAGNOSIS — Z7689 Persons encountering health services in other specified circumstances: Secondary | ICD-10-CM | POA: Diagnosis not present

## 2021-08-30 DIAGNOSIS — R300 Dysuria: Secondary | ICD-10-CM | POA: Diagnosis not present

## 2021-10-02 DIAGNOSIS — M25561 Pain in right knee: Secondary | ICD-10-CM | POA: Diagnosis not present

## 2021-10-02 DIAGNOSIS — E782 Mixed hyperlipidemia: Secondary | ICD-10-CM | POA: Diagnosis not present

## 2021-10-02 DIAGNOSIS — M25462 Effusion, left knee: Secondary | ICD-10-CM | POA: Diagnosis not present

## 2021-10-02 DIAGNOSIS — I252 Old myocardial infarction: Secondary | ICD-10-CM | POA: Diagnosis not present

## 2021-10-02 DIAGNOSIS — Z7689 Persons encountering health services in other specified circumstances: Secondary | ICD-10-CM | POA: Diagnosis not present

## 2021-10-02 DIAGNOSIS — R0789 Other chest pain: Secondary | ICD-10-CM | POA: Diagnosis not present

## 2021-10-02 DIAGNOSIS — G8929 Other chronic pain: Secondary | ICD-10-CM | POA: Diagnosis not present

## 2021-10-02 DIAGNOSIS — M25461 Effusion, right knee: Secondary | ICD-10-CM | POA: Diagnosis not present

## 2021-10-02 DIAGNOSIS — M7989 Other specified soft tissue disorders: Secondary | ICD-10-CM | POA: Diagnosis not present

## 2021-10-02 DIAGNOSIS — I10 Essential (primary) hypertension: Secondary | ICD-10-CM | POA: Diagnosis not present

## 2021-10-24 DIAGNOSIS — R079 Chest pain, unspecified: Secondary | ICD-10-CM | POA: Diagnosis not present

## 2021-10-24 DIAGNOSIS — R0789 Other chest pain: Secondary | ICD-10-CM | POA: Diagnosis not present

## 2021-10-24 DIAGNOSIS — I252 Old myocardial infarction: Secondary | ICD-10-CM | POA: Diagnosis not present

## 2021-10-29 DIAGNOSIS — I10 Essential (primary) hypertension: Secondary | ICD-10-CM | POA: Diagnosis not present

## 2021-10-29 DIAGNOSIS — E782 Mixed hyperlipidemia: Secondary | ICD-10-CM | POA: Diagnosis not present

## 2021-10-29 DIAGNOSIS — Z7689 Persons encountering health services in other specified circumstances: Secondary | ICD-10-CM | POA: Diagnosis not present

## 2021-10-29 DIAGNOSIS — I252 Old myocardial infarction: Secondary | ICD-10-CM | POA: Diagnosis not present

## 2021-10-29 DIAGNOSIS — I25118 Atherosclerotic heart disease of native coronary artery with other forms of angina pectoris: Secondary | ICD-10-CM | POA: Diagnosis not present

## 2021-10-29 DIAGNOSIS — R0789 Other chest pain: Secondary | ICD-10-CM | POA: Diagnosis not present

## 2021-11-05 DIAGNOSIS — R0789 Other chest pain: Secondary | ICD-10-CM | POA: Diagnosis not present

## 2021-11-05 DIAGNOSIS — I25118 Atherosclerotic heart disease of native coronary artery with other forms of angina pectoris: Secondary | ICD-10-CM | POA: Diagnosis not present

## 2021-11-05 DIAGNOSIS — I252 Old myocardial infarction: Secondary | ICD-10-CM | POA: Diagnosis not present

## 2021-11-15 DIAGNOSIS — R931 Abnormal findings on diagnostic imaging of heart and coronary circulation: Secondary | ICD-10-CM | POA: Diagnosis not present

## 2021-11-15 DIAGNOSIS — Z87891 Personal history of nicotine dependence: Secondary | ICD-10-CM | POA: Diagnosis not present

## 2021-11-15 DIAGNOSIS — Z01818 Encounter for other preprocedural examination: Secondary | ICD-10-CM | POA: Diagnosis not present

## 2021-11-15 DIAGNOSIS — Z8249 Family history of ischemic heart disease and other diseases of the circulatory system: Secondary | ICD-10-CM | POA: Diagnosis not present

## 2021-11-15 DIAGNOSIS — E782 Mixed hyperlipidemia: Secondary | ICD-10-CM | POA: Diagnosis not present

## 2021-11-15 DIAGNOSIS — I252 Old myocardial infarction: Secondary | ICD-10-CM | POA: Diagnosis not present

## 2021-11-15 DIAGNOSIS — I25118 Atherosclerotic heart disease of native coronary artery with other forms of angina pectoris: Secondary | ICD-10-CM | POA: Diagnosis not present

## 2021-11-15 DIAGNOSIS — R0789 Other chest pain: Secondary | ICD-10-CM | POA: Diagnosis not present

## 2021-11-15 DIAGNOSIS — Z79899 Other long term (current) drug therapy: Secondary | ICD-10-CM | POA: Diagnosis not present

## 2021-11-15 DIAGNOSIS — I10 Essential (primary) hypertension: Secondary | ICD-10-CM | POA: Diagnosis not present

## 2021-11-15 DIAGNOSIS — Z8679 Personal history of other diseases of the circulatory system: Secondary | ICD-10-CM | POA: Diagnosis not present

## 2021-11-15 DIAGNOSIS — Z7982 Long term (current) use of aspirin: Secondary | ICD-10-CM | POA: Diagnosis not present

## 2021-11-27 DIAGNOSIS — R0789 Other chest pain: Secondary | ICD-10-CM | POA: Diagnosis not present

## 2021-11-27 DIAGNOSIS — Z7689 Persons encountering health services in other specified circumstances: Secondary | ICD-10-CM | POA: Diagnosis not present

## 2022-02-19 DIAGNOSIS — Z1231 Encounter for screening mammogram for malignant neoplasm of breast: Secondary | ICD-10-CM | POA: Diagnosis not present

## 2022-04-05 DIAGNOSIS — Z7689 Persons encountering health services in other specified circumstances: Secondary | ICD-10-CM | POA: Diagnosis not present

## 2022-04-05 DIAGNOSIS — H6592 Unspecified nonsuppurative otitis media, left ear: Secondary | ICD-10-CM | POA: Diagnosis not present

## 2022-04-05 DIAGNOSIS — Z20822 Contact with and (suspected) exposure to covid-19: Secondary | ICD-10-CM | POA: Diagnosis not present

## 2022-04-05 DIAGNOSIS — J069 Acute upper respiratory infection, unspecified: Secondary | ICD-10-CM | POA: Diagnosis not present

## 2022-05-01 DIAGNOSIS — K5904 Chronic idiopathic constipation: Secondary | ICD-10-CM | POA: Diagnosis not present

## 2022-05-01 DIAGNOSIS — Z7689 Persons encountering health services in other specified circumstances: Secondary | ICD-10-CM | POA: Diagnosis not present

## 2022-05-01 DIAGNOSIS — K5900 Constipation, unspecified: Secondary | ICD-10-CM | POA: Diagnosis not present

## 2022-05-01 DIAGNOSIS — K5989 Other specified functional intestinal disorders: Secondary | ICD-10-CM | POA: Diagnosis not present

## 2022-05-01 DIAGNOSIS — Z9889 Other specified postprocedural states: Secondary | ICD-10-CM | POA: Diagnosis not present

## 2022-05-01 DIAGNOSIS — R1084 Generalized abdominal pain: Secondary | ICD-10-CM | POA: Diagnosis not present

## 2022-05-20 DIAGNOSIS — D485 Neoplasm of uncertain behavior of skin: Secondary | ICD-10-CM | POA: Diagnosis not present

## 2022-05-20 DIAGNOSIS — I781 Nevus, non-neoplastic: Secondary | ICD-10-CM | POA: Diagnosis not present

## 2022-05-20 DIAGNOSIS — L821 Other seborrheic keratosis: Secondary | ICD-10-CM | POA: Diagnosis not present

## 2022-05-20 DIAGNOSIS — C441122 Basal cell carcinoma of skin of right lower eyelid, including canthus: Secondary | ICD-10-CM | POA: Diagnosis not present

## 2022-05-20 DIAGNOSIS — L820 Inflamed seborrheic keratosis: Secondary | ICD-10-CM | POA: Diagnosis not present

## 2022-05-20 DIAGNOSIS — Z85828 Personal history of other malignant neoplasm of skin: Secondary | ICD-10-CM | POA: Diagnosis not present

## 2022-05-20 DIAGNOSIS — Z129 Encounter for screening for malignant neoplasm, site unspecified: Secondary | ICD-10-CM | POA: Diagnosis not present

## 2022-05-24 DIAGNOSIS — R194 Change in bowel habit: Secondary | ICD-10-CM | POA: Diagnosis not present

## 2022-05-24 DIAGNOSIS — D126 Benign neoplasm of colon, unspecified: Secondary | ICD-10-CM | POA: Diagnosis not present

## 2022-05-28 DIAGNOSIS — D126 Benign neoplasm of colon, unspecified: Secondary | ICD-10-CM | POA: Diagnosis not present

## 2022-05-28 DIAGNOSIS — K649 Unspecified hemorrhoids: Secondary | ICD-10-CM | POA: Diagnosis not present

## 2022-05-28 DIAGNOSIS — Z86010 Personal history of colonic polyps: Secondary | ICD-10-CM | POA: Diagnosis not present

## 2022-05-28 DIAGNOSIS — K5730 Diverticulosis of large intestine without perforation or abscess without bleeding: Secondary | ICD-10-CM | POA: Diagnosis not present

## 2022-07-02 DIAGNOSIS — Z23 Encounter for immunization: Secondary | ICD-10-CM | POA: Diagnosis not present

## 2022-07-02 DIAGNOSIS — M810 Age-related osteoporosis without current pathological fracture: Secondary | ICD-10-CM | POA: Diagnosis not present

## 2022-07-02 DIAGNOSIS — Z7689 Persons encountering health services in other specified circumstances: Secondary | ICD-10-CM | POA: Diagnosis not present

## 2022-07-02 DIAGNOSIS — I25118 Atherosclerotic heart disease of native coronary artery with other forms of angina pectoris: Secondary | ICD-10-CM | POA: Diagnosis not present

## 2022-07-02 DIAGNOSIS — K5904 Chronic idiopathic constipation: Secondary | ICD-10-CM | POA: Diagnosis not present

## 2022-07-02 DIAGNOSIS — Z0000 Encounter for general adult medical examination without abnormal findings: Secondary | ICD-10-CM | POA: Diagnosis not present

## 2022-07-02 DIAGNOSIS — Z8679 Personal history of other diseases of the circulatory system: Secondary | ICD-10-CM | POA: Diagnosis not present

## 2022-07-05 DIAGNOSIS — E559 Vitamin D deficiency, unspecified: Secondary | ICD-10-CM | POA: Diagnosis not present

## 2022-07-05 DIAGNOSIS — I25118 Atherosclerotic heart disease of native coronary artery with other forms of angina pectoris: Secondary | ICD-10-CM | POA: Diagnosis not present

## 2022-07-05 DIAGNOSIS — E782 Mixed hyperlipidemia: Secondary | ICD-10-CM | POA: Diagnosis not present

## 2022-07-05 DIAGNOSIS — I10 Essential (primary) hypertension: Secondary | ICD-10-CM | POA: Diagnosis not present

## 2022-07-05 DIAGNOSIS — Z79899 Other long term (current) drug therapy: Secondary | ICD-10-CM | POA: Diagnosis not present

## 2022-07-11 DIAGNOSIS — C441122 Basal cell carcinoma of skin of right lower eyelid, including canthus: Secondary | ICD-10-CM | POA: Diagnosis not present

## 2022-07-25 DIAGNOSIS — H524 Presbyopia: Secondary | ICD-10-CM | POA: Diagnosis not present

## 2022-07-25 DIAGNOSIS — H35363 Drusen (degenerative) of macula, bilateral: Secondary | ICD-10-CM | POA: Diagnosis not present

## 2022-07-25 DIAGNOSIS — H43393 Other vitreous opacities, bilateral: Secondary | ICD-10-CM | POA: Diagnosis not present

## 2022-07-25 DIAGNOSIS — H2513 Age-related nuclear cataract, bilateral: Secondary | ICD-10-CM | POA: Diagnosis not present

## 2022-07-25 DIAGNOSIS — H43811 Vitreous degeneration, right eye: Secondary | ICD-10-CM | POA: Diagnosis not present

## 2022-07-25 DIAGNOSIS — H3093 Unspecified chorioretinal inflammation, bilateral: Secondary | ICD-10-CM | POA: Diagnosis not present

## 2022-08-01 DIAGNOSIS — Z7689 Persons encountering health services in other specified circumstances: Secondary | ICD-10-CM | POA: Diagnosis not present

## 2022-08-01 DIAGNOSIS — R058 Other specified cough: Secondary | ICD-10-CM | POA: Diagnosis not present
